# Patient Record
Sex: Male | Born: 1990 | Race: White | Hispanic: No | Marital: Single | Smoking: Never smoker
Health system: Southern US, Community
[De-identification: ages and names within clinical notes are randomized; demographics above are authoritative.]

## PROBLEM LIST (undated history)

## (undated) HISTORY — PX: OTHER SURGICAL HISTORY: SHX169

---

## 2011-11-16 ENCOUNTER — Encounter (HOSPITAL_COMMUNITY): Payer: Self-pay | Admitting: Emergency Medicine

## 2011-11-16 ENCOUNTER — Emergency Department (HOSPITAL_COMMUNITY)
Admission: EM | Admit: 2011-11-16 | Discharge: 2011-11-16 | Disposition: A | Discharge status: Home / Self Care | Payer: BC Managed Care – PPO | Attending: Emergency Medicine | Admitting: Emergency Medicine

## 2011-11-16 DIAGNOSIS — H9311 Tinnitus, right ear: Secondary | ICD-10-CM

## 2011-11-16 DIAGNOSIS — H9319 Tinnitus, unspecified ear: Secondary | ICD-10-CM | POA: Insufficient documentation

## 2011-11-16 DIAGNOSIS — L299 Pruritus, unspecified: Secondary | ICD-10-CM | POA: Insufficient documentation

## 2011-11-16 DIAGNOSIS — R21 Rash and other nonspecific skin eruption: Secondary | ICD-10-CM | POA: Insufficient documentation

## 2011-11-16 LAB — RPR: RPR Ser Ql: NONREACTIVE

## 2011-11-16 MED ORDER — CARBAMIDE PEROXIDE 6.5 % OT SOLN: 5.0000 [drp] | Freq: Two times a day (BID) | OTIC | Status: AC

## 2011-11-16 MED ORDER — DIPHENHYDRAMINE HCL 12.5 MG/5ML PO ELIX
25.0000 mg | ORAL_SOLUTION | ORAL | Status: AC
  Administered 2011-11-16: 25 mg via ORAL
  Filled 2011-11-16: qty 10

## 2011-11-16 NOTE — ED Notes (Signed)
Pt states he has a rash for a couple weeks now  Pt has rash noted on his torso  Pt states it itches  Unknown cause  Pt states a couple hours ago he started having ringing in his right ear

## 2011-11-16 NOTE — Discharge Instructions (Signed)
Please call the dermatologist above, or the dermatologist of your choice for further evaluation of your rash.  You may use benadryl as directed on the packaging for itching.  Use the prescribed ear drop solution to clear the wax from your ear, which may help with the ringing.  If you develop fevers, sores or swelling in your mouth, difficulty swallowing or breathing, or severe dizziness, return immediately to the ER for a recheck.  You may return to the ER at any time for worsening condition or any new symptoms that concern you.  Rash A rash is a change in the color or texture of your skin. There are many different types of rashes. You may have other problems that accompany your rash. CAUSES   Infections.   Allergic reactions. This can include allergies to pets or foods.   Certain medicines.   Exposure to certain chemicals, soaps, or cosmetics.   Heat.   Exposure to poisonous plants.   Tumors, both cancerous and noncancerous.  SYMPTOMS   Redness.   Scaly skin.   Itchy skin.   Dry or cracked skin.   Bumps.   Blisters.   Pain.  DIAGNOSIS  Your caregiver may do a physical exam to determine what type of rash you have. A skin sample (biopsy) may be taken and examined under a microscope. TREATMENT  Treatment depends on the type of rash you have. Your caregiver may prescribe certain medicines. For serious conditions, you may need to see a skin doctor (dermatologist). HOME CARE INSTRUCTIONS   Avoid the substance that caused your rash.   Do not scratch your rash. This can cause infection.   You may take cool baths to help stop itching.   Only take over-the-counter or prescription medicines as directed by your caregiver.   Keep all follow-up appointments as directed by your caregiver.  SEEK IMMEDIATE MEDICAL CARE IF:  You have increasing pain, swelling, or redness.   You have a fever.   You have new or severe symptoms.   You have body aches, diarrhea, or vomiting.    Your rash is not better after 3 days.  MAKE SURE YOU:  Understand these instructions.   Will watch your condition.   Will get help right away if you are not doing well or get worse.  Document Released: 07/07/2002 Document Revised: 07/06/2011 Document Reviewed: 05/01/2011 [facility] Patient Information 2012 [place], [place].  Tinnitus Sounds you hear in your ears and coming from within the ear is called tinnitus. This can be a symptom of many ear disorders. It is often associated with hearing loss.  Tinnitus can be seen with:  Infections.   Ear blockages such as wax buildup.   Meniere's disease.   Ear damage.   Inherited.   Occupational causes.  While irritating, it is not usually a threat to health. When the cause of the tinnitus is wax, infection in the middle ear, or foreign body it is easily treated. Hearing loss will usually be reversible.  TREATMENT  When treating the underlying cause does not get rid of tinnitus, it may be necessary to get rid of the unwanted sound by covering it up with more pleasant background noises. This may include music, the radio etc. There are tinnitus maskers which can be worn which produce background noise to cover up the tinnitus. Avoid all medications which tend to make tinnitus worse such as alcohol, caffeine, aspirin, and nicotine. There are many soothing background tapes such as rain, ocean, thunderstorms, etc. These soothing sounds  help with sleeping or resting. Keep all follow-up appointments and referrals. This is important to identify the cause of the problem. It also helps avoid complications, impaired hearing, disability, or chronic pain. Document Released: 07/17/2005 Document Revised: 07/06/2011 Document Reviewed: 03/04/2008 [facility] Patient Information 2012 [place], [place].

## 2011-11-16 NOTE — ED Provider Notes (Signed)
History     CSN: [id number]  Arrival date & time 11/16/11  0217   First MD Initiated Contact with Patient 11/16/11 [phone]      Chief Complaint  Patient presents with  . Rash    (Consider location/radiation/quality/duration/timing/severity/associated sxs/prior treatment) HPI Comments: Patient reports he has had a rash on his torso for approximately one month.  States that initially it was just red and did not hurt or itch but today it started itching and he began having ringing in his right ear.  States he switched soaps at the beginning of the month and thought that might be the cause, he is still using the possibly offending soap.  He has taken nothing for the rash, denies any past medical history including STDs. Denies fevers, joint pain, ear pain.    Patient is a 21 y.o. male presenting with rash. The history is provided by the patient.  Rash     History reviewed. No pertinent past medical history.  Past Surgical History  Procedure Date  . Cyst removed from back      Family History  Problem Relation Age of Onset  . Hypertension Other     History  Substance Use Topics  . Smoking status: Never Smoker   . Smokeless tobacco: Current User    Types: Snuff  . Alcohol Use: Yes     social      Review of Systems  Constitutional: Negative for fever and chills.  HENT: Positive for tinnitus. Negative for ear pain and sore throat.   Respiratory: Negative for cough and shortness of breath.   Cardiovascular: Negative for chest pain.  Gastrointestinal: Negative for nausea, vomiting, abdominal pain and diarrhea.  Genitourinary: Negative for dysuria and discharge.  Skin: Positive for rash.  Neurological: Negative for weakness and numbness.  All other systems reviewed and are negative.    Allergies  Review of patient's allergies indicates no known allergies.  Home Medications   Current Outpatient Rx  Name Route Sig Dispense Refill  . AMPHETAMINE-DEXTROAMPHETAMINE 10 MG  PO TABS Oral Take 10 mg by mouth daily as needed. Maybe 3 times a week      BP 137/79  Pulse 77  Temp(Src) 98.2 F (36.8 C) (Oral)  Resp 16  SpO2 99%  Physical Exam  Constitutional: He is oriented to person, place, and time. He appears well-developed and well-nourished.  HENT:  Head: Normocephalic and atraumatic.  Right Ear: Tympanic membrane normal.  Left Ear: Tympanic membrane and ear canal normal.       Right ear with cerumen, not impacted or obstructing TM.    Neck: Neck supple.  Cardiovascular: Normal rate, regular rhythm and normal heart sounds.   Pulmonary/Chest: Effort normal and breath sounds normal. No respiratory distress. He has no wheezes. He has no rales. He exhibits no tenderness.  Neurological: He is alert and oriented to person, place, and time.  Skin: Rash noted.       Blanching raised erythematous plaques, some solitary and some confluent lesions, located only on his chest, shoulders, and a few small lesions on his low back.  No rash on lower extremities, face, or palms and soles.      ED Course  Procedures (including critical care time)  Labs Reviewed - No data to display No results found.  Patient also examined by and discussed with Dr Patria Mane.   1. Rash   2. Tinnitus, right       MDM  Patient with rash x 1 month that  is largely unchanged, no associated symptoms.  Tonight he developed ringing in his right ear and diffuse itching, which I believe is unrelated.  The rash is not allergic, not infectious.  No systemic symptoms.  However, Dr Patria Mane and I are unsure of exact etiology of rash, which we have discussed with the patient.  Pt to return for any changing or worsening symptoms.  Pt d/c home with dermatology follow up, ear drops to clean wax from right ear.  Patient verbalizes understanding and agrees with plan.          Dillard Cannon [place], [place] 11/16/11 [phone]

## 2011-11-16 NOTE — ED Provider Notes (Signed)
Medical screening examination/treatment/procedure(s) were conducted as a shared visit with non-physician practitioner(s) and myself.  I personally evaluated the patient during the encounter  I don't believe the rash in the ring in his ears are related at all.  His rash does not appear to be allergic in nature.  The rash has been present for approximately one month. it Blanches  And is macular in nature.  I think the patient would best be evaluated by a dermatologist.  This does not appear to be life threatening.  Medical screening examination performed.   Lyanne Co, MD 11/16/11 [phone]

## 2020-05-13 ENCOUNTER — Other Ambulatory Visit: Payer: Self-pay

## 2020-05-13 ENCOUNTER — Emergency Department (HOSPITAL_COMMUNITY)
Admission: EM | Admit: 2020-05-13 | Discharge: 2020-05-14 | Disposition: A | Discharge status: Home / Self Care | Payer: HRSA Program | Attending: Emergency Medicine | Admitting: Emergency Medicine

## 2020-05-13 DIAGNOSIS — R1012 Left upper quadrant pain: Secondary | ICD-10-CM | POA: Diagnosis present

## 2020-05-13 DIAGNOSIS — U071 COVID-19: Secondary | ICD-10-CM | POA: Diagnosis not present

## 2020-05-13 LAB — URINALYSIS, ROUTINE W REFLEX MICROSCOPIC
Bilirubin Urine: NEGATIVE
Glucose, UA: NEGATIVE mg/dL
Hgb urine dipstick: NEGATIVE
Ketones, ur: NEGATIVE mg/dL
Leukocytes,Ua: NEGATIVE
Nitrite: NEGATIVE
Protein, ur: NEGATIVE mg/dL
Specific Gravity, Urine: 1.012 (ref 1.005–1.030)
pH: 6 (ref 5.0–8.0)

## 2020-05-13 LAB — CBC
HCT: 46 % (ref 39.0–52.0)
Hemoglobin: 15.5 g/dL (ref 13.0–17.0)
MCH: 29.2 pg (ref 26.0–34.0)
MCHC: 33.7 g/dL (ref 30.0–36.0)
MCV: 86.8 fL (ref 80.0–100.0)
Platelets: 225 10*3/uL (ref 150–400)
RBC: 5.3 MIL/uL (ref 4.22–5.81)
RDW: 12.8 % (ref 11.5–15.5)
WBC: 6.8 10*3/uL (ref 4.0–10.5)
nRBC: 0 % (ref 0.0–0.2)

## 2020-05-13 NOTE — ED Triage Notes (Signed)
Per pt he started having LUQ pain that started today. No n/v pt said does not radiate. Pt said he did have a fever tonight.. No urination issues but testicles were sore tonight and tender to touch.

## 2020-05-14 ENCOUNTER — Emergency Department (HOSPITAL_COMMUNITY): Payer: HRSA Program

## 2020-05-14 LAB — RESP PANEL BY RT PCR (RSV, FLU A&B, COVID)
Influenza A by PCR: NEGATIVE
Influenza B by PCR: NEGATIVE
Respiratory Syncytial Virus by PCR: NEGATIVE
SARS Coronavirus 2 by RT PCR: POSITIVE — AB

## 2020-05-14 LAB — BASIC METABOLIC PANEL
Anion gap: 15 (ref 5–15)
BUN: 6 mg/dL (ref 6–20)
CO2: 21 mmol/L — ABNORMAL LOW (ref 22–32)
Calcium: 8.8 mg/dL — ABNORMAL LOW (ref 8.9–10.3)
Chloride: 94 mmol/L — ABNORMAL LOW (ref 98–111)
Creatinine, Ser: 0.99 mg/dL (ref 0.61–1.24)
GFR, Estimated: >60 mL/min (ref >60)
Glucose, Bld: 119 mg/dL — ABNORMAL HIGH (ref 70–99)
Potassium: 3.2 mmol/L — ABNORMAL LOW (ref 3.5–5.1)
Sodium: 130 mmol/L — ABNORMAL LOW (ref 135–145)

## 2020-05-14 MED ORDER — ACETAMINOPHEN 325 MG PO TABS
650.0000 mg | ORAL_TABLET | Freq: Once | ORAL | Status: AC
  Administered 2020-05-14: 650 mg via ORAL
  Filled 2020-05-14: qty 2

## 2020-05-14 MED ORDER — SODIUM CHLORIDE 0.9 % IV BOLUS
500.0000 mL | Freq: Once | INTRAVENOUS | Status: AC
  Administered 2020-05-14: 500 mL via INTRAVENOUS

## 2020-05-14 MED ORDER — ONDANSETRON 4 MG PO TBDP: 4.0000 mg | ORAL_TABLET | Freq: Three times a day (TID) | ORAL | 0 refills | Status: DC | PRN

## 2020-05-14 MED ORDER — POTASSIUM CHLORIDE CRYS ER 20 MEQ PO TBCR: 20.0000 meq | EXTENDED_RELEASE_TABLET | Freq: Two times a day (BID) | ORAL | 0 refills | Status: DC

## 2020-05-14 NOTE — ED Provider Notes (Signed)
[facility] EMERGENCY DEPARTMENT Provider Note   CSN: [id number] Arrival date & time: 05/13/20  2254     History Chief Complaint  Patient presents with  . Flank Pain    Christopher Esparza is a 29 y.o. male with no significant past medical history who presents the emergency department with a chief complaint of left upper quadrant pain, onset today.  The patient reports that he developed pain in his left upper quadrant earlier today that has been constant since onset.  Pain has not been worsening.  He characterizes the pain as achy and is 6 out of 10.  No known aggravating or alleviating factors.  He also reports that he had a fever and chills earlier today.  T-max 101.8 in the ER tonight and one episode of nonbloody, nonbilious vomiting and nausea.  He also reports that he has had a nonproductive cough over the last few days.  No chest pain, shortness of breath, diarrhea, constipation, loss of sense of taste or smell, dysuria, hematuria, penile discharge, back pain, neck pain.  He reports that after waiting for several hours in the ER that he developed some discomfort in his bilateral testicles, but reports that this has since resolved.  He suspects it may have been from sitting in the chair for several hours while waiting to be seen due to long wait times.  Patient reports that he googled his symptoms online, and he came for evaluation of his spleen as he is concerned that it might burst after researching his symptoms on the Internet.  No known sick contacts.  He is not vaccinated against COVID-19.  He is a never smoker.  Denies illicit or recreational substance use.  The history is provided by the patient and medical records. No language interpreter was used.       No past medical history on file.  There are no problems to display for this patient.   Past Surgical History:  Procedure Laterality Date  . cyst removed from back          Family History  Problem  Relation Age of Onset  . Hypertension Other     Social History   Tobacco Use  . Smoking status: Never Smoker  . Smokeless tobacco: Current User    Types: Snuff  Substance Use Topics  . Alcohol use: Yes    Comment: social  . Drug use: No    Home Medications Prior to Admission medications   Medication Sig Start Date End Date Taking? Authorizing Provider  amphetamine-dextroamphetamine (ADDERALL) 10 MG tablet Take 10 mg by mouth daily as needed. Maybe 3 times a week    [provider]  ondansetron (ZOFRAN ODT) 4 MG disintegrating tablet Take 1 tablet (4 mg total) by mouth every 8 (eight) hours as needed. 05/14/20   Youa Deloney A, PA-C  potassium chloride SA (KLOR-CON) 20 MEQ tablet Take 1 tablet (20 mEq total) by mouth 2 (two) times daily for 5 days. 05/14/20 05/19/20  Kirsten Spearing A, PA-C    Allergies    Patient has no allergy information on record.  Review of Systems   Review of Systems  Constitutional: Positive for chills and fever. Negative for appetite change.  Respiratory: Positive for cough and shortness of breath.   Cardiovascular: Negative for chest pain.  Gastrointestinal: Negative for abdominal pain.  Genitourinary: Positive for flank pain. Negative for dysuria.  Musculoskeletal: Negative for back pain.  Skin: Negative for rash.  Allergic/Immunologic: Negative for immunocompromised state.  Neurological: Negative for headaches.  Psychiatric/Behavioral: Negative for confusion.    Physical Exam Updated Vital Signs BP 137/88   Pulse 88   Temp 98.7 F (37.1 C)   Resp 18   Ht 5\' 11"  (1.803 m)   Wt 113.8 kg   SpO2 98%   BMI 34.99 kg/m   Physical Exam Vitals and nursing note reviewed.  Constitutional:      General: He is not in acute distress.    Appearance: He is well-developed. He is not ill-appearing, toxic-appearing or diaphoretic.     Comments: Well-appearing.  No acute distress.  HENT:     Head: Normocephalic.  Eyes:      Conjunctiva/sclera: Conjunctivae normal.  Cardiovascular:     Rate and Rhythm: Regular rhythm. Tachycardia present.     Heart sounds: No murmur heard.   Pulmonary:     Effort: Pulmonary effort is normal. No respiratory distress.     Breath sounds: No stridor. No wheezing, rhonchi or rales.     Comments: Lungs are clear to auscultation bilaterally. Chest:     Chest wall: No tenderness.  Abdominal:     General: There is no distension.     Palpations: Abdomen is soft. There is no mass.     Tenderness: There is no abdominal tenderness. There is no right CVA tenderness, left CVA tenderness, guarding or rebound.     Hernia: No hernia is present.     Comments: Abdomen is soft, nontender, nondistended.  Normoactive bowel sounds.  No hepatosplenomegaly.  No CVA tenderness bilaterally.  Negative Murphy sign.  No tenderness over McBurney's point.  Musculoskeletal:     Cervical back: Neck supple.     Right lower leg: No edema.     Left lower leg: No edema.  Skin:    General: Skin is warm and dry.     Capillary Refill: Capillary refill takes less than 2 seconds.     Coloration: Skin is not jaundiced.     Comments: Skin is hot to the touch.  Neurological:     Mental Status: He is alert.  Psychiatric:        Behavior: Behavior normal.     ED Results / Procedures / Treatments   Labs (all labs ordered are listed, but only abnormal results are displayed) Labs Reviewed  RESP PANEL BY RT PCR (RSV, FLU A&B, COVID) - Abnormal; Notable for the following components:      Result Value   SARS Coronavirus 2 by RT PCR POSITIVE (*)    All other components within normal limits  BASIC METABOLIC PANEL - Abnormal; Notable for the following components:   Sodium 130 (*)    Potassium 3.2 (*)    Chloride 94 (*)    CO2 21 (*)    Glucose, Bld 119 (*)    Calcium 8.8 (*)    All other components within normal limits  URINALYSIS, ROUTINE W REFLEX MICROSCOPIC  CBC    EKG None  Radiology DG Chest  Portable 1 View  Result Date: 05/14/2020 CLINICAL DATA:  Cough and fever EXAM: PORTABLE CHEST 1 VIEW COMPARISON:  None. FINDINGS: The heart size and mediastinal contours are within normal limits. Both lungs are clear. The visualized skeletal structures are unremarkable. IMPRESSION: No active disease. Electronically Signed   By: 05/16/2020 M.D.   On: 05/14/2020 03:18    Procedures Procedures (including critical care time)  Medications Ordered in ED Medications  acetaminophen (TYLENOL) tablet 650 mg (650 mg Oral Given 05/14/20  0310)  sodium chloride 0.9 % bolus 500 mL (0 mLs Intravenous Stopped 05/14/20 4580)    ED Course  I have reviewed the triage vital signs and the nursing notes.  Pertinent labs & imaging results that were available during my care of the patient were reviewed by me and considered in my medical decision making (see chart for details).    MDM Rules/Calculators/A&P                          29 year old male who is unvaccinated against COVID-19 presenting with nonproductive cough over the last few days accompanied by pain in his left upper quadrant, nausea, vomiting, fever, chills.  Febrile to 1-1.8 in the ER.  Tachycardic in the 110s.  He is normotensive.  No tachypnea or hypoxia.  He has no complaints of shortness of breath or chest pain.   On exam, he has no reproducible tenderness palpation of the left upper quadrant.  No CVA tenderness on the left.  His abdominal exam is benign.  Given that he has also had a cough over the last few days with new fever, chills, nausea, and vomiting, there is concern for COVID-19.  COVID-19 test is pending.  Labs are notable for mild hyponatremia.  Urinalysis is unremarkable.  He has a mild hypokalemia.  We will give 5 cc of fluid boluses patient has been vomiting.  He was given Zofran and was successfully fluid challenge.  Abdominal exam is benign and I doubt cholecystitis, splenomegaly, mononucleosis, UTI, pyelonephritis, peptic  ulcer disease, or pancreatitis.  COVID-19 test is positive, which I suspect is the etiology of the patient's symptoms.  Patient was made aware of this positive test.  He does qualify for monoclonal antibody infusion given his BMI and onset of symptoms.  I have reached out to the monoclonal antibody infusion team who will contact the patient for further evaluation after he has been discharged.  He will also be given a referral to the post COVID-19 clinic.  All questions answered.  He is agreeable with this plan at this time.  CDC quarantine precautions have also been discussed with the patient and included on his discharge instructions.  Will discharge home with Zofran for supportive care and recommended Motrin and Tylenol for good control of his fever.  Since he has been in the ER he has defervesced and tachycardia is resolved.  ER return precautions given.  He is hemodynamically stable and in no acute distress.  Safe for discharge to home with outpatient follow-up as needed. Final Clinical Impression(s) / ED Diagnoses Final diagnoses:  COVID-19    Rx / DC Orders ED Discharge Orders         Ordered    ondansetron (ZOFRAN ODT) 4 MG disintegrating tablet  Every 8 hours PRN        05/14/20 0617    potassium chloride SA (KLOR-CON) 20 MEQ tablet  2 times daily        05/14/20 0619           Frederik Pear A, PA-C 05/14/20 1019    Gilda Crease, MD 05/17/20 [phone]

## 2020-05-14 NOTE — ED Notes (Signed)
Pt given a cup of water 

## 2020-05-14 NOTE — Discharge Instructions (Addendum)
Thank you for allowing me to care for you today in the Emergency Department.   You tested positive for COVID-19 today.  You need to quarantine at home for a total of 14 days from 10/14, which is when your symptoms began.  Controlling your fever will help you feel so much better.  Take 650 mg of Tylenol or 600 mg of ibuprofen with food every 6 hours for pain or fever.  You can alternate between these 2 medications every 3 hours if your pain returns.  For instance, you can take Tylenol at noon, followed by a dose of ibuprofen at 3, followed by second dose of Tylenol and 6.  Do not take more than 4000 mg of Tylenol from all sources in a 24-hour period.  If you develop nausea or vomiting, let 1 tablet of Zofran dissolve in your tongue every 8 hours as needed.   You can purchase a pulse oximeter, which is available over-the-counter at places such as [facility] or [facility], to monitor your oxygen levels at home.  You should be receiving a call from the monoclonal antibody infusion center to get you scheduled for an infusion, which may help reduce this if your symptoms related to COVID-19.  Your potassium was slightly low today.  Take 1 tablet of potassium 2 times daily for the next 5 days.  Return to the emergency department if you develop respiratory distress, if your finger lips turn blue, if you pass out, develop severe chest pain, or other new, concerning symptoms.

## 2020-05-15 ENCOUNTER — Other Ambulatory Visit: Payer: Self-pay | Admitting: Family

## 2020-05-15 ENCOUNTER — Telehealth: Payer: Self-pay | Admitting: Family

## 2020-05-15 DIAGNOSIS — Z6834 Body mass index (BMI) 34.0-34.9, adult: Secondary | ICD-10-CM

## 2020-05-15 DIAGNOSIS — U071 COVID-19: Secondary | ICD-10-CM

## 2020-05-15 NOTE — Telephone Encounter (Signed)
Called to Discuss with patient about Covid symptoms and the use of the monoclonal antibody infusion for those with mild to moderate Covid symptoms and at a high risk of hospitalization.     Pt appears to qualify for this infusion due to co-morbid conditions and/or a member of an at-risk group in accordance with the FDA Emergency Use Authorization.    Christopher Esparza was evaluated at the [facility], ED on 05/13/2020 for worsening abdominal pain.  Covid test was found to be positive.  Qualifying factors include BMI greater than 25 (34). Currently experiencing fatigue. Symptoms started 10/13.   I spoke with Christopher Esparza regarding the risks, benefits, and potential financial cost of treatment with monoclonal antibody therapy. He wishes to continue with treatment at this time.  Hello Christopher Esparza,   You have been scheduled to receive the monoclonal antibody therapy at [facility]: 05/16/20 at 9:30 am   If you have been tested outside of a [facility] - you MUST bring a copy of your positive test with you the morning of your appointment. You may take a photo of this and upload to your MyChart portal or have the testing facility fax the result to [phone]    The address for the infusion clinic site is:  --GPS address is [address] - the parking is located near [employer] building where you will see  COVID19 Infusion feather banner marking the entrance to parking.   (see photos below)            --Enter into the 2nd entrance where the "wave, flag banner" is at the road. Turn into this 2nd entrance and immediately turn left to park in 1 of the 5 parking spots.   --Please stay in your car and call the desk for assistance inside [phone].   --Average time in department is roughly 2 hours for Regeneron treatment - this includes preparation of the medication, IV start and the required 1 hour monitoring after the infusion.    Should you develop worsening shortness of  breath, chest pain or severe breathing problems please do not wait for this appointment and go to the Emergency room for evaluation and treatment. You will undergo another oxygen screen before your infusion to ensure this is the best treatment option for you. There is a chance that the best decision may be to send you to the Emergency Room for evaluation at the time of your appointment.   The day of your visit you should: Marland Kitchen Get plenty of rest the night before and drink plenty of water . Eat a light meal/snack before coming and take your medications as prescribed  . Wear warm, comfortable clothes with a shirt that can roll-up over the elbow (will need IV start).  . Wear a mask  . Consider bringing some activity to help pass the time  Many commercial insurers are waiving bills related to COVID treatment however some have ranged from $300-640. We are starting to see some insurers send bills to patients later for the administration of the medication - we are learning more information but you may receive a bill after your appointment.  Please contact your insurance agent to discuss prior to your appointment if you would like further details about billing specific to your policy.    The CPT code is [phone] for your reference.    I hope this helps find you feeling better,  Christopher Esparza

## 2020-05-15 NOTE — Progress Notes (Signed)
I connected by phone with Christopher Esparza on 05/15/2020 at 12:33 PM to discuss the potential use of a new treatment for mild to moderate COVID-19 viral infection in non-hospitalized patients.  This patient is a 29 y.o. male that meets the FDA criteria for Emergency Use Authorization of COVID monoclonal antibody casirivimab/imdevimab or bamlanivimab/eteseviamb.  Has a (+) direct SARS-CoV-2 viral test result  Has mild or moderate COVID-19   Is NOT hospitalized due to COVID-19  Is within 10 days of symptom onset  Has at least one of the high risk factor(s) for progression to severe COVID-19 and/or hospitalization as defined in EUA.  Specific high risk criteria : BMI > 25   I have spoken and communicated the following to the patient or parent/caregiver regarding COVID monoclonal antibody treatment:  1. FDA has authorized the emergency use for the treatment of mild to moderate COVID-19 in adults and pediatric patients with positive results of direct SARS-CoV-2 viral testing who are 10 years of age and older weighing at least 40 kg, and who are at high risk for progressing to severe COVID-19 and/or hospitalization.  2. The significant known and potential risks and benefits of COVID monoclonal antibody, and the extent to which such potential risks and benefits are unknown.  3. Information on available alternative treatments and the risks and benefits of those alternatives, including clinical trials.  4. Patients treated with COVID monoclonal antibody should continue to self-isolate and use infection control measures (e.g., wear mask, isolate, social distance, avoid sharing personal items, clean and disinfect "high touch" surfaces, and frequent handwashing) according to CDC guidelines.   5. The patient or parent/caregiver has the option to accept or refuse COVID monoclonal antibody treatment.  After reviewing this information with the patient, the patient has agreed to receive one of the  available covid 19 monoclonal antibodies and will be provided an appropriate fact sheet prior to infusion.   Jeanine Luz, FNP 05/15/2020 12:33 PM

## 2020-05-16 ENCOUNTER — Ambulatory Visit (HOSPITAL_COMMUNITY)
Admission: RE | Admit: 2020-05-16 | Discharge: 2020-05-16 | Disposition: A | Discharge status: Home / Self Care | Payer: HRSA Program | Source: Ambulatory Visit | Attending: Pulmonary Disease | Admitting: Pulmonary Disease

## 2020-05-16 DIAGNOSIS — U071 COVID-19: Secondary | ICD-10-CM | POA: Diagnosis present

## 2020-05-16 DIAGNOSIS — Z6834 Body mass index (BMI) 34.0-34.9, adult: Secondary | ICD-10-CM | POA: Insufficient documentation

## 2020-05-16 MED ORDER — SODIUM CHLORIDE 0.9 % IV SOLN: Freq: Once | INTRAVENOUS | Status: AC

## 2020-05-16 MED ORDER — EPINEPHRINE 0.3 MG/0.3ML IJ SOAJ: 0.3000 mg | Freq: Once | INTRAMUSCULAR | Status: DC | PRN

## 2020-05-16 MED ORDER — SODIUM CHLORIDE 0.9 % IV SOLN: INTRAVENOUS | Status: DC | PRN

## 2020-05-16 MED ORDER — FAMOTIDINE IN NACL 20-0.9 MG/50ML-% IV SOLN: 20.0000 mg | Freq: Once | INTRAVENOUS | Status: DC | PRN

## 2020-05-16 MED ORDER — ALBUTEROL SULFATE HFA 108 (90 BASE) MCG/ACT IN AERS: 2.0000 | INHALATION_SPRAY | Freq: Once | RESPIRATORY_TRACT | Status: DC | PRN

## 2020-05-16 MED ORDER — DIPHENHYDRAMINE HCL 50 MG/ML IJ SOLN: 50.0000 mg | Freq: Once | INTRAMUSCULAR | Status: DC | PRN

## 2020-05-16 MED ORDER — METHYLPREDNISOLONE SODIUM SUCC 125 MG IJ SOLR: 125.0000 mg | Freq: Once | INTRAMUSCULAR | Status: DC | PRN

## 2020-05-16 NOTE — Discharge Instructions (Signed)

## 2020-05-16 NOTE — Progress Notes (Signed)
  Diagnosis: COVID-19  Physician:Dr Wright   Procedure: [facility] Med: bamlanivimab\etesevimab infusion - Provided patient with bamlanimivab\etesevimab fact sheet for patients, parents and caregivers prior to infusion.  Complications: No immediate complications noted.  Discharge: Discharged home   Christopher Esparza 05/16/2020  

## 2021-06-29 IMAGING — DX DG CHEST 1V PORT
1 series · 1 of 1 positions shown · non-contrast
Comparison: None.

CLINICAL DATA: Cough and fever

EXAM:
PORTABLE CHEST 1 VIEW

[chest ap]
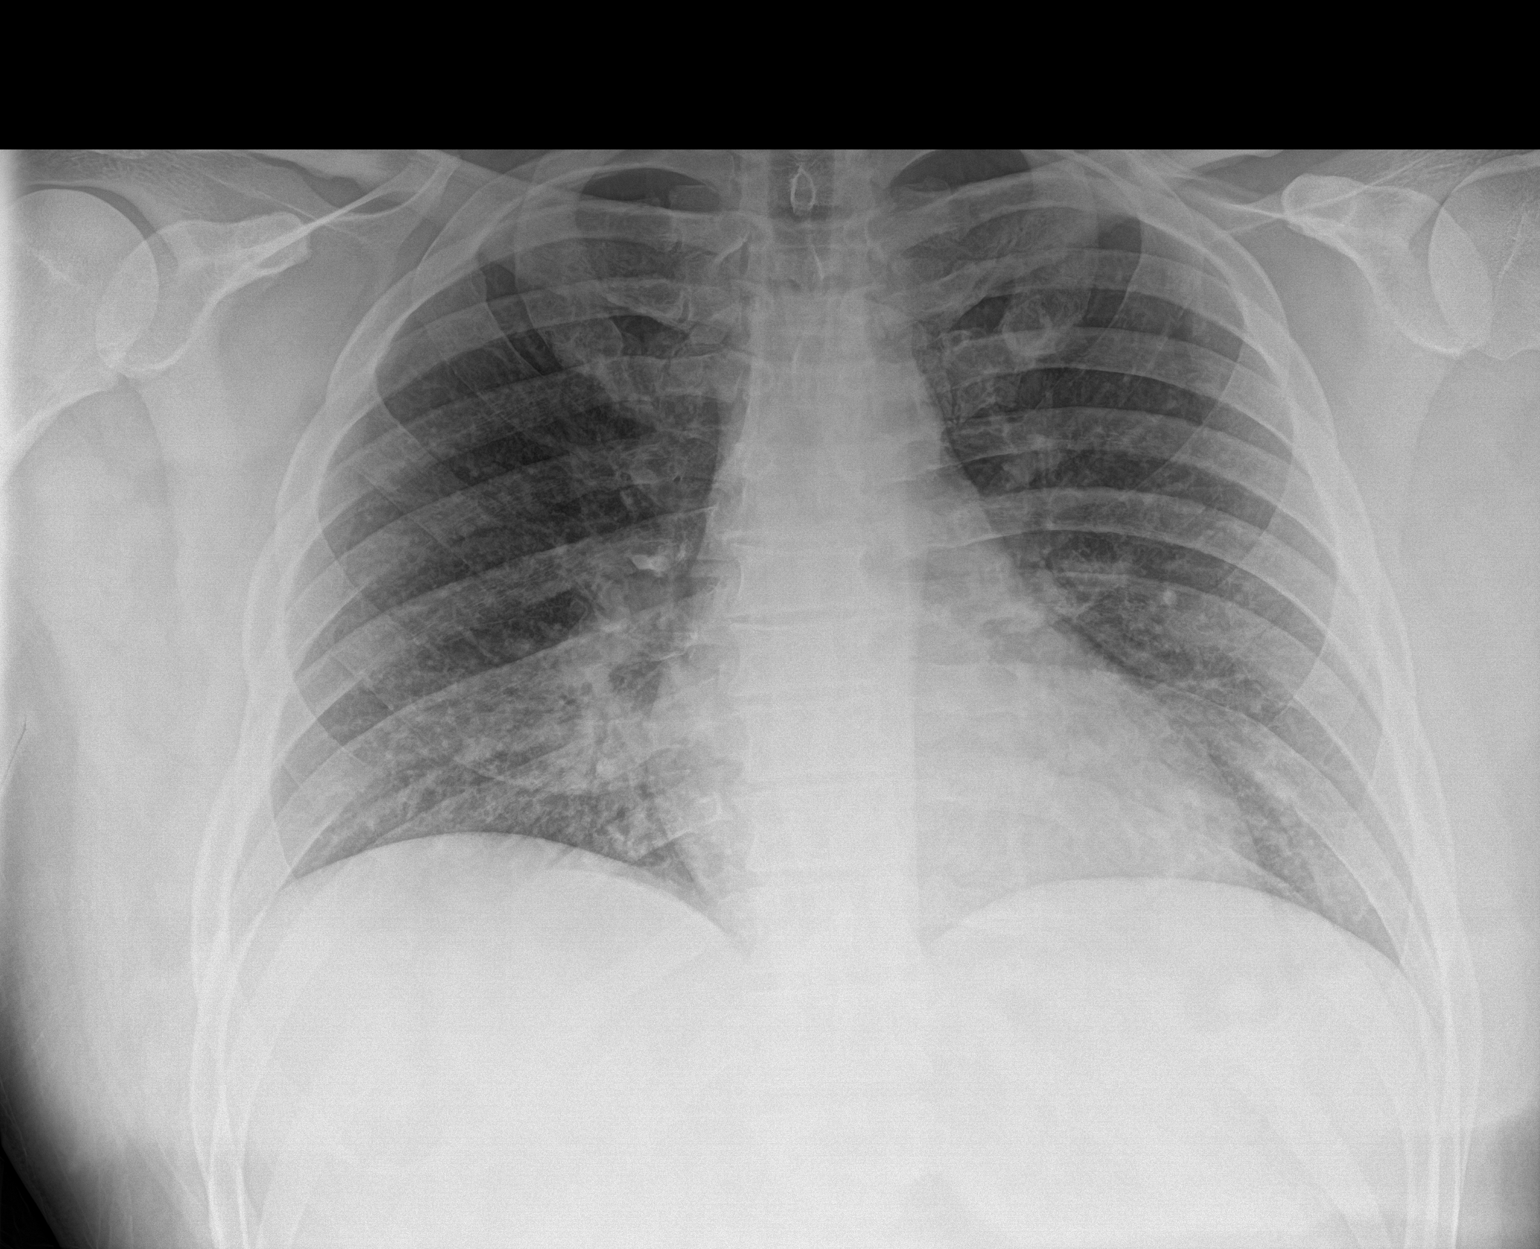

[1 of 1 positions shown; findings below may reference images not displayed]

FINDINGS: The heart size and mediastinal contours are within normal limits.
Both lungs are clear. The visualized skeletal structures are
unremarkable.
IMPRESSION: No active disease.

## 2022-07-25 ENCOUNTER — Ambulatory Visit
Admission: EM | Admit: 2022-07-25 | Discharge: 2022-07-25 | Disposition: A | Discharge status: Home / Self Care | Payer: 59 | Attending: Emergency Medicine | Admitting: Emergency Medicine

## 2022-07-25 DIAGNOSIS — J989 Respiratory disorder, unspecified: Secondary | ICD-10-CM

## 2022-07-25 DIAGNOSIS — T7840XA Allergy, unspecified, initial encounter: Secondary | ICD-10-CM

## 2022-07-25 MED ORDER — METHYLPREDNISOLONE 8 MG PO TABS: 32.0000 mg | ORAL_TABLET | Freq: Every day | ORAL | 0 refills | Status: AC

## 2022-07-25 MED ORDER — ALBUTEROL SULFATE (2.5 MG/3ML) 0.083% IN NEBU
2.5000 mg | INHALATION_SOLUTION | Freq: Once | RESPIRATORY_TRACT | Status: AC
  Administered 2022-07-25: 2.5 mg via RESPIRATORY_TRACT

## 2022-07-25 MED ORDER — FEXOFENADINE HCL 180 MG PO TABS: 180.0000 mg | ORAL_TABLET | Freq: Every day | ORAL | 1 refills | Status: AC

## 2022-07-25 MED ORDER — MOMETASONE FUROATE 50 MCG/ACT NA SUSP: 2.0000 | Freq: Every day | NASAL | 2 refills | Status: AC

## 2022-07-25 MED ORDER — ALBUTEROL SULFATE HFA 108 (90 BASE) MCG/ACT IN AERS: 2.0000 | INHALATION_SPRAY | Freq: Four times a day (QID) | RESPIRATORY_TRACT | 0 refills | Status: AC | PRN

## 2022-07-25 NOTE — ED Provider Notes (Signed)
[facility]    CSN: [id number] Arrival date & time: 07/25/22  9678    HISTORY   Chief Complaint  Patient presents with   Cough   Nasal Congestion   Sore Throat   HPI Christopher Esparza is a pleasant, 31 y.o. male who presents to urgent care today. Patient complains of a 3-day history of nonproductive cough, nasal congestion, postnasal drip, sore throat from coughing.  Patient denies fever, body aches, chills, nausea, vomiting, diarrhea, known sick contacts.  Patient reports a history of allergies, not currently taking allergy medications.  Patient states he constantly feels like he needs to blow his nose but nothing comes out.  The history is provided by the patient.   History reviewed. No pertinent past medical history. There are no problems to display for this patient.  Past Surgical History:  Procedure Laterality Date   cyst removed from back       Home Medications    Prior to Admission medications   Not on File    Family History Family History  Problem Relation Age of Onset   Hypertension Other    Social History Social History   Tobacco Use   Smoking status: Never   Smokeless tobacco: Current    Types: Snuff  Substance Use Topics   Alcohol use: Yes    Comment: social   Drug use: No   Allergies   Patient has no known allergies.  Review of Systems Review of Systems Pertinent findings revealed after performing a 14 point review of systems has been noted in the history of present illness.  Physical Exam Triage Vital Signs ED Triage Vitals  Enc Vitals Group     BP 05/27/21 0827 (!) 147/82     Pulse Rate 05/27/21 0827 72     Resp 05/27/21 0827 18     Temp 05/27/21 0827 98.3 F (36.8 C)     Temp Source 05/27/21 0827 Oral     SpO2 05/27/21 0827 98 %     Weight --      Height --      Head Circumference --      Peak Flow --      Pain Score 05/27/21 0826 5     Pain Loc --      Pain Edu? --      Excl. in GC? --   No data  found.  Updated Vital Signs BP (!) 150/88 (BP Location: Right Arm)   Pulse (!) 103   Temp 99.8 F (37.7 C) (Oral)   Resp 18   SpO2 97%   Physical Exam Vitals and nursing note reviewed.  Constitutional:      General: He is not in acute distress.    Appearance: Normal appearance. He is not ill-appearing.  HENT:     Head: Normocephalic and atraumatic.     Salivary Glands: Right salivary gland is not diffusely enlarged or tender. Left salivary gland is not diffusely enlarged or tender.     Right Ear: Ear canal and external ear normal. No drainage. A middle ear effusion is present. There is no impacted cerumen. Tympanic membrane is bulging. Tympanic membrane is not injected or erythematous.     Left Ear: Ear canal and external ear normal. No drainage. A middle ear effusion is present. There is no impacted cerumen. Tympanic membrane is bulging. Tympanic membrane is not injected or erythematous.     Ears:     Comments: Bilateral EACs normal, both TMs bulging with clear fluid  Nose: Rhinorrhea present. No nasal deformity, septal deviation, signs of injury, nasal tenderness, mucosal edema or congestion. Rhinorrhea is clear.     Right Nostril: Occlusion present. No foreign body, epistaxis or septal hematoma.     Left Nostril: Occlusion present. No foreign body, epistaxis or septal hematoma.     Right Turbinates: Enlarged, swollen and pale.     Left Turbinates: Enlarged, swollen and pale.     Right Sinus: No maxillary sinus tenderness or frontal sinus tenderness.     Left Sinus: No maxillary sinus tenderness or frontal sinus tenderness.     Mouth/Throat:     Lips: Pink. No lesions.     Mouth: Mucous membranes are moist. No oral lesions.     Pharynx: Oropharynx is clear. Uvula midline. No posterior oropharyngeal erythema or uvula swelling.     Tonsils: No tonsillar exudate. 0 on the right. 0 on the left.     Comments: Postnasal drip Eyes:     General: Lids are normal.        Right eye: No  discharge.        Left eye: No discharge.     Extraocular Movements: Extraocular movements intact.     Conjunctiva/sclera: Conjunctivae normal.     Right eye: Right conjunctiva is not injected.     Left eye: Left conjunctiva is not injected.  Neck:     Trachea: Trachea and phonation normal.  Cardiovascular:     Rate and Rhythm: Normal rate and regular rhythm.     Pulses: Normal pulses.     Heart sounds: Normal heart sounds. No murmur heard.    No friction rub. No gallop.  Pulmonary:     Effort: Pulmonary effort is normal. Prolonged expiration present. No tachypnea, bradypnea, accessory muscle usage, respiratory distress or retractions.     Breath sounds: No stridor, decreased air movement or transmitted upper airway sounds. Examination of the right-lower field reveals decreased breath sounds. Examination of the left-lower field reveals decreased breath sounds. Decreased breath sounds present. No wheezing, rhonchi or rales.  Chest:     Chest wall: No tenderness.  Musculoskeletal:        General: Normal range of motion.     Cervical back: Normal range of motion and neck supple. Normal range of motion.  Lymphadenopathy:     Cervical: No cervical adenopathy.  Skin:    General: Skin is warm and dry.     Findings: No erythema or rash.  Neurological:     General: No focal deficit present.     Mental Status: He is alert and oriented to person, place, and time.  Psychiatric:        Mood and Affect: Mood normal.        Behavior: Behavior normal.     Visual Acuity Right Eye Distance:   Left Eye Distance:   Bilateral Distance:    Right Eye Near:   Left Eye Near:    Bilateral Near:     UC Couse / Diagnostics / Procedures:     Radiology No results found.  Procedures Procedures (including critical care time) EKG  Pending results:  Labs Reviewed - No data to display  Medications Ordered in UC: Medications  albuterol (PROVENTIL) (2.5 MG/3ML) 0.083% nebulizer solution 2.5 mg  (2.5 mg Nebulization Given 07/25/22 2040)    UC Diagnoses / Final Clinical Impressions(s)   I have reviewed the triage vital signs and the nursing notes.  Pertinent labs & imaging results that were available during  my care of the patient were reviewed by me and considered in my medical decision making (see chart for details).    Final diagnoses:  Allergic disorder of respiratory system, initial encounter   Patient provided with prescriptions for of allergy medications and a 3-day course of steroids to expedite resolution of respiratory inflammation improved symptoms.  Patient also advised to begin to use albuterol regularly for cough and shortness of breath as needed.  Return precautions advised. Please see discharge instructions below for further details of plan of care as provided to patient. ED Prescriptions     Medication Sig Dispense Auth. Provider   methylPREDNISolone (MEDROL) 8 MG tablet Take 4 tablets (32 mg total) by mouth daily with breakfast for 3 days. 12 tablet Theadora Rama Scales, PA-C   fexofenadine (ALLEGRA) 180 MG tablet Take 1 tablet (180 mg total) by mouth daily. 90 tablet Theadora Rama Scales, PA-C   mometasone (NASONEX) 50 MCG/ACT nasal spray Place 2 sprays into the nose daily. 1 each Theadora Rama Scales, PA-C   albuterol (VENTOLIN HFA) 108 (90 Base) MCG/ACT inhaler Inhale 2 puffs into the lungs every 6 (six) hours as needed for wheezing or shortness of breath (Cough). 18 g Theadora Rama Scales, PA-C      PDMP not reviewed this encounter.  Disposition Upon Discharge:  Condition: stable for discharge home Home: take medications as prescribed; routine discharge instructions as discussed; follow up as advised.  Patient presented with an acute illness with associated systemic symptoms and significant discomfort requiring urgent management. In my opinion, this is a condition that a prudent lay person (someone who possesses an average knowledge of health and  medicine) may potentially expect to result in complications if not addressed urgently such as respiratory distress, impairment of bodily function or dysfunction of bodily organs.   Routine symptom specific, illness specific and/or disease specific instructions were discussed with the patient and/or caregiver at length.   As such, the patient has been evaluated and assessed, work-up was performed and treatment was provided in alignment with urgent care protocols and evidence based medicine.  Patient/parent/caregiver has been advised that the patient may require follow up for further testing and treatment if the symptoms continue in spite of treatment, as clinically indicated and appropriate.  If the patient was tested for COVID-19, Influenza and/or RSV, then the patient/parent/guardian was advised to isolate at home pending the results of his/her diagnostic coronavirus test and potentially longer if they're positive. I have also advised pt that if his/her COVID-19 test returns positive, it's recommended to self-isolate for at least 10 days after symptoms first appeared AND until fever-free for 24 hours without fever reducer AND other symptoms have improved or resolved. Discussed self-isolation recommendations as well as instructions for household member/close contacts as per the [facility] and [place] DHHS, and also gave patient the COVID packet with this information.  Patient/parent/caregiver has been advised to return to the [facility] or PCP in 3-5 days if no better; to PCP or the Emergency Department if new signs and symptoms develop, or if the current signs or symptoms continue to change or worsen for further workup, evaluation and treatment as clinically indicated and appropriate  The patient will follow up with their current PCP if and as advised. If the patient does not currently have a PCP we will assist them in obtaining one.   The patient may need specialty follow up if the symptoms continue, in spite of  conservative treatment and management, for further workup, evaluation, consultation and  treatment as clinically indicated and appropriate.  Patient/parent/caregiver verbalized understanding and agreement of plan as discussed.  All questions were addressed during visit.  Please see discharge instructions below for further details of plan.  Discharge Instructions:   Discharge Instructions      Your symptoms and my physical exam findings are concerning for exacerbation of your underlying allergies affecting both your upper and lower respiratory tracts.     Please see the list below for recommended medications, dosages and frequencies to provide relief of current symptoms:     Medrol (methylprednisolone): This is a steroid that will significantly calm your upper and lower airways, please take the daily recommended quantity of tablets daily with your breakfast meal starting tomorrow morning until the prescription is complete.      Allegra (fexofenadine): This is an excellent second-generation antihistamine that helps to reduce respiratory inflammatory response to environmental allergens.  This medication is not known to cause daytime sleepiness so it can be taken in the daytime.  If you find that it does make you sleepy, please feel free to take it at bedtime.   Nasonex (mometasone): This is a steroid nasal spray that you use once daily, 2 sprays in each nare.  This medication does not work well if you decide to use it only used as you feel you need to, it works best used on a daily basis.  After 3 to 5 days of use, you will notice significant reduction of the inflammation and mucus production that is currently being caused by exposure to allergens, whether seasonal or environmental.  The most common side effect of this medication is nosebleeds.  If you experience a nosebleed, please discontinue use for 1 week, then feel free to resume.  I have provided you with a prescription.     ProAir, Ventolin,  Proventil (albuterol): This inhaled medication contains a short acting beta agonist bronchodilator.  This medication works on the smooth muscle that opens and constricts of your airways by relaxing the muscle.  The result of relaxation of the smooth muscle is increased air movement and improved work of breathing.  This is a short acting medication that can be used every 4-6 hours as needed for increased work of breathing, shortness of breath, wheezing and excessive coughing.  I have provided you with a prescription.    If you find that you have not had improvement of your symptoms in the next 5 to 7 days, please follow-up with your primary care provider or return here to urgent care for repeat evaluation and further recommendations.   Thank you for visiting urgent care today.  We appreciate the opportunity to participate in your care.       This office note has been dictated using Teaching laboratory technicianDragon speech recognition software.  Unfortunately, this method of dictation can sometimes lead to typographical or grammatical errors.  I apologize for your inconvenience in advance if this occurs.  Please do not hesitate to reach out to me if clarification is needed.      Theadora RamaMorgan, Giancarlo Askren Scales, PA-C 07/27/22 1523

## 2022-07-25 NOTE — ED Triage Notes (Signed)
Pt reports having cough, congestion, sore throat, upper back pain (resolved).   Started: Saturday   Home interventions: none

## 2022-07-25 NOTE — Discharge Instructions (Addendum)
Your symptoms and my physical exam findings are concerning for exacerbation of your underlying allergies affecting both your upper and lower respiratory tracts.     Please see the list below for recommended medications, dosages and frequencies to provide relief of current symptoms:     Medrol (methylprednisolone): This is a steroid that will significantly calm your upper and lower airways, please take the daily recommended quantity of tablets daily with your breakfast meal starting tomorrow morning until the prescription is complete.      Allegra (fexofenadine): This is an excellent second-generation antihistamine that helps to reduce respiratory inflammatory response to environmental allergens.  This medication is not known to cause daytime sleepiness so it can be taken in the daytime.  If you find that it does make you sleepy, please feel free to take it at bedtime.   Nasonex (mometasone): This is a steroid nasal spray that you use once daily, 2 sprays in each nare.  This medication does not work well if you decide to use it only used as you feel you need to, it works best used on a daily basis.  After 3 to 5 days of use, you will notice significant reduction of the inflammation and mucus production that is currently being caused by exposure to allergens, whether seasonal or environmental.  The most common side effect of this medication is nosebleeds.  If you experience a nosebleed, please discontinue use for 1 week, then feel free to resume.  I have provided you with a prescription.     ProAir, Ventolin, Proventil (albuterol): This inhaled medication contains a short acting beta agonist bronchodilator.  This medication works on the smooth muscle that opens and constricts of your airways by relaxing the muscle.  The result of relaxation of the smooth muscle is increased air movement and improved work of breathing.  This is a short acting medication that can be used every 4-6 hours as needed for increased  work of breathing, shortness of breath, wheezing and excessive coughing.  I have provided you with a prescription.    If you find that you have not had improvement of your symptoms in the next 5 to 7 days, please follow-up with your primary care provider or return here to urgent care for repeat evaluation and further recommendations.   Thank you for visiting urgent care today.  We appreciate the opportunity to participate in your care.

## 2022-09-07 ENCOUNTER — Encounter (HOSPITAL_BASED_OUTPATIENT_CLINIC_OR_DEPARTMENT_OTHER): Payer: Self-pay

## 2022-09-07 ENCOUNTER — Other Ambulatory Visit: Payer: Self-pay

## 2022-09-07 ENCOUNTER — Emergency Department (HOSPITAL_BASED_OUTPATIENT_CLINIC_OR_DEPARTMENT_OTHER)
Admission: EM | Admit: 2022-09-07 | Discharge: 2022-09-07 | Disposition: A | Discharge status: Home / Self Care | Payer: 59 | Attending: Emergency Medicine | Admitting: Emergency Medicine

## 2022-09-07 DIAGNOSIS — S025XXA Fracture of tooth (traumatic), initial encounter for closed fracture: Secondary | ICD-10-CM | POA: Insufficient documentation

## 2022-09-07 DIAGNOSIS — I10 Essential (primary) hypertension: Secondary | ICD-10-CM | POA: Insufficient documentation

## 2022-09-07 DIAGNOSIS — X58XXXA Exposure to other specified factors, initial encounter: Secondary | ICD-10-CM | POA: Insufficient documentation

## 2022-09-07 MED ORDER — AMLODIPINE BESYLATE 2.5 MG PO TABS: 2.5000 mg | ORAL_TABLET | Freq: Every day | ORAL | 1 refills | Status: AC

## 2022-09-07 NOTE — ED Triage Notes (Signed)
Pt reports he chipped a tooth his front right tooth about two weeks ago and is reporting his mouth feels weird and wants to make sure he does not have an infection

## 2022-09-07 NOTE — Discharge Instructions (Addendum)
You have been evaluated for your symptoms.  Fortunately your dental injury is unlikely to cause any nerve damage as it is fairly shallow.  You may follow-up with your dentist for further care.  They may help follow your teeth to make it smooth if indicated.  Your blood pressure is elevated today, it is important for you to follow-up with your primary care doctor for blood pressure recheck.  If you are unable to follow-up in a timely manner, take blood pressure medication prescribed and be sure to talk to your doctor about potential undiagnosed hypertension.

## 2022-09-07 NOTE — ED Provider Notes (Signed)
[place] [facility] [place] [facility] Provider Note   CSN: [id number] Arrival date & time: 09/07/22  1918     History  Chief Complaint  Patient presents with   Dental Pain    Christopher Esparza is a 32 y.o. male.  The history is provided by the patient. No language interpreter was used.  Dental Pain    32 year old male presenting complaining of dental injury.  Patient report he was chewing and accidentally chipped his front tooth 2 weeks ago.  He denies any significant pain but report occasional tingling sensation to the affected tooth.  He worries that may have some nerve injury.  He wants to assess for any potential infection.  He denies any fever, dental pain, trouble swallowing, chest pain or shortness of breath.  Home Medications Prior to Admission medications   Medication Sig Start Date End Date Taking? Authorizing Provider  albuterol (VENTOLIN HFA) 108 (90 Base) MCG/ACT inhaler Inhale 2 puffs into the lungs every 6 (six) hours as needed for wheezing or shortness of breath (Cough). 07/25/22   Lynden Oxford Scales, PA-C  fexofenadine (ALLEGRA) 180 MG tablet Take 1 tablet (180 mg total) by mouth daily. 07/25/22 01/21/23  Lynden Oxford Scales, PA-C  mometasone (NASONEX) 50 MCG/ACT nasal spray Place 2 sprays into the nose daily. 07/25/22 10/23/22  Lynden Oxford Scales, PA-C      Allergies    Patient has no known allergies.    Review of Systems   Review of Systems  All other systems reviewed and are negative.   Physical Exam Updated Vital Signs BP (!) 173/94 (BP Location: Left Arm)   Pulse 95   Temp 98.5 F (36.9 C) (Oral)   Resp 18   Ht 5' 10"$  (1.778 m)   Wt 120.2 kg   SpO2 100%   BMI 38.02 kg/m  Physical Exam Vitals and nursing note reviewed.  Constitutional:      General: He is not in acute distress.    Appearance: He is well-developed.  HENT:     Head: Atraumatic.     Mouth/Throat:     Comments: Right upper front central incisor is  a very small Ellis 1 tooth fracture without any signs of infection.  No tenderness to palpation.  Sensation is intact.  No dental intrusion or extrusion. Eyes:     Conjunctiva/sclera: Conjunctivae normal.  Musculoskeletal:     Cervical back: Neck supple.  Skin:    Findings: No rash.  Neurological:     Mental Status: He is alert.     ED Results / Procedures / Treatments   Labs (all labs ordered are listed, but only abnormal results are displayed) Labs Reviewed - No data to display  EKG None  Radiology No results found.  Procedures Procedures    Medications Ordered in ED Medications - No data to display  ED Course/ Medical Decision Making/ A&P                             Medical Decision Making  BP (!) 173/94 (BP Location: Left Arm)   Pulse 95   Temp 98.5 F (36.9 C) (Oral)   Resp 18   Ht 5' 10"$  (1.778 m)   Wt 120.2 kg   SpO2 100%   BMI 38.02 kg/m   76:84 PM  31 year old male presenting complaining of dental injury.  Patient report he was chewing and accidentally chipped his front tooth 2 weeks ago.  He denies any significant pain but report occasional tingling sensation to the affected tooth.  He worries that may have some nerve injury.  He wants to assess for any potential infection.  He denies any fever, dental pain, trouble swallowing, chest pain or shortness of breath.  On exam, the upper right front central incisor is a very small Lissa Merlin type I tooth fracture without any nerve injury and no signs of infection.  It is nontender to palpation.  No dental intrusion or extrusion.  I gave patient reassurance.  Patient certainly can follow-up with his dentist outpatient for further care.  Patient noted to have elevated blood pressure of 173/94.  No document history of hypertension.  EMR reviewed, patient has had elevated blood pressure in the past.  I suspect elevated blood pressure today could be in the setting of anxiety but could also be due to uncontrolled  hypertension.  I recommend patient to follow-up with PCP for blood pressure recheck but will also prescribe amlodipine for 1 month with 1 refill.        Final Clinical Impression(s) / ED Diagnoses Final diagnoses:  Closed fracture of tooth, initial encounter  Uncontrolled hypertension    Rx / DC Orders ED Discharge Orders          Ordered    amLODipine (NORVASC) 2.5 MG tablet  Daily        09/07/22 2033              Domenic Moras, PA-C 09/07/22 2034    Elgie Congo, MD 09/08/22 [phone]
